# Patient Record
Sex: Male | Born: 2000 | Hispanic: Yes | Marital: Single
Health system: Southern US, Community
[De-identification: ages and names within clinical notes are randomized; demographics above are authoritative.]

---

## 2020-09-13 ENCOUNTER — Encounter (HOSPITAL_COMMUNITY): Payer: Self-pay | Admitting: Emergency Medicine

## 2020-09-13 ENCOUNTER — Emergency Department (HOSPITAL_COMMUNITY)
Admission: EM | Admit: 2020-09-13 | Discharge: 2020-09-13 | Disposition: A | Discharge status: Home / Self Care | Payer: No Typology Code available for payment source | Attending: Emergency Medicine | Admitting: Emergency Medicine

## 2020-09-13 ENCOUNTER — Emergency Department (HOSPITAL_COMMUNITY): Payer: No Typology Code available for payment source

## 2020-09-13 DIAGNOSIS — S0181XA Laceration without foreign body of other part of head, initial encounter: Secondary | ICD-10-CM

## 2020-09-13 DIAGNOSIS — S0591XA Unspecified injury of right eye and orbit, initial encounter: Secondary | ICD-10-CM | POA: Diagnosis present

## 2020-09-13 DIAGNOSIS — S01111A Laceration without foreign body of right eyelid and periocular area, initial encounter: Secondary | ICD-10-CM | POA: Insufficient documentation

## 2020-09-13 MED ORDER — TETANUS-DIPHTH-ACELL PERTUSSIS 5-2.5-18.5 LF-MCG/0.5 IM SUSY
0.5000 mL | PREFILLED_SYRINGE | Freq: Once | INTRAMUSCULAR | Status: DC
  Filled 2020-09-13: qty 0.5

## 2020-09-13 MED ORDER — TETANUS-DIPHTH-ACELL PERTUSSIS 5-2.5-18.5 LF-MCG/0.5 IM SUSY: 0.5000 mL | PREFILLED_SYRINGE | Freq: Once | INTRAMUSCULAR | Status: DC

## 2020-09-13 MED ORDER — LIDOCAINE-EPINEPHRINE-TETRACAINE (LET) TOPICAL GEL
3.0000 mL | Freq: Once | TOPICAL | Status: AC
  Administered 2020-09-13: 3 mL via TOPICAL
  Filled 2020-09-13: qty 3
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
## 2020-09-13 NOTE — ED Notes (Signed)
Pt refused DTap. Pt educated about why DTap is given by both MD and RN.
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
## 2020-09-13 NOTE — Discharge Instructions (Addendum)
You can put vasoline on the wound to help keep it clean and help heal.
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
